# Patient Record
Sex: Female | Born: 2005 | Hispanic: Yes | Marital: Married | Smoking: Never smoker
Health system: Southern US, Community
[De-identification: ages and names within clinical notes are randomized; demographics above are authoritative.]

## PROBLEM LIST (undated history)

## (undated) DIAGNOSIS — F419 Anxiety disorder, unspecified: Secondary | ICD-10-CM

---
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
## 2023-11-05 ENCOUNTER — Ambulatory Visit: Admission: EM | Admit: 2023-11-05 | Discharge: 2023-11-05 | Disposition: A | Discharge status: Home / Self Care

## 2023-11-05 DIAGNOSIS — H6501 Acute serous otitis media, right ear: Secondary | ICD-10-CM

## 2023-11-05 DIAGNOSIS — H9201 Otalgia, right ear: Secondary | ICD-10-CM

## 2023-11-05 HISTORY — DX: Anxiety disorder, unspecified: F41.9

## 2023-11-05 MED ORDER — PSEUDOEPHEDRINE-GUAIFENESIN ER 60-600 MG PO TB12: 1.0000 | ORAL_TABLET | Freq: Two times a day (BID) | ORAL | 0 refills | Status: AC

## 2023-11-05 MED ORDER — IPRATROPIUM BROMIDE 0.06 % NA SOLN: 2.0000 | Freq: Four times a day (QID) | NASAL | 0 refills | Status: AC

## 2023-11-05 NOTE — ED Provider Notes (Signed)
 [facility]    CSN: [id number] Arrival date & time: 11/05/23  0841      History   Chief Complaint Chief Complaint  Patient presents with   Otalgia   Ear Fullness    HPI Janice Thornton is a 18 y.o. female presenting for onset of reduced hearing, right ear fullness and pressure yesterday.  Denies drainage from ear, fever, cough, congestion or sore throat.  No dizziness or headaches.  Has taken Tylenol over-the-counter.  History of allergies.  Takes Xyzal.  HPI  Past Medical History:  Diagnosis Date   Anxiety     There are no active problems to display for this patient.   No past surgical history on file.  OB History   No obstetric history on file.      Home Medications    Prior to Admission medications   Medication Sig Start Date End Date Taking? Authorizing Provider  famotidine (PEPCID) 20 MG tablet Take 20 mg by mouth 2 (two) times daily.   Yes [provider]  FLUoxetine (PROZAC) 20 MG capsule Take 20 mg by mouth daily. 08/13/23  Yes [provider]  ipratropium (ATROVENT ) 0.06 % nasal spray Place 2 sprays into both nostrils 4 (four) times daily. 11/05/23  Yes Nancy Axon B, PA-C  levocetirizine (XYZAL) 5 MG tablet SMARTSIG:1 Tablet(s) By Mouth Every Evening 08/19/23  Yes [provider]  metFORMIN (GLUCOPHAGE) 500 MG tablet Take 500 mg by mouth 2 (two) times daily. 08/15/23  Yes [provider]  montelukast (SINGULAIR) 10 MG tablet Take 10 mg by mouth daily. 08/13/23  Yes [provider]  pseudoephedrine -guaifenesin  (MUCINEX  D) 60-600 MG 12 hr tablet Take 1 tablet by mouth every 12 (twelve) hours. 11/05/23  Yes Floydene Hy, PA-C    Family History Family History  Problem Relation Age of Onset   Hypertension Mother    Diabetes Maternal Grandmother    Hypertension Maternal Grandfather    Diabetes Maternal Grandfather    Diabetes Paternal Grandmother     Social History Social History   Tobacco Use    Smoking status: Never   Smokeless tobacco: Never  Vaping Use   Vaping status: Never Used  Substance Use Topics   Alcohol use: Never   Drug use: Never     Allergies   Patient has no known allergies.   Review of Systems Review of Systems  Constitutional:  Negative for chills, diaphoresis, fatigue and fever.  HENT:  Positive for ear pain and hearing loss. Negative for congestion, ear discharge, rhinorrhea, sinus pressure, sinus pain and sore throat.   Respiratory:  Negative for cough.   Gastrointestinal:  Negative for nausea and vomiting.  Musculoskeletal:  Negative for neck pain.  Skin:  Negative for rash.  Neurological:  Negative for dizziness, weakness and headaches.  Hematological:  Negative for adenopathy.     Physical Exam Triage Vital Signs ED Triage Vitals  Encounter Vitals Group     BP      Systolic BP Percentile      Diastolic BP Percentile      Pulse      Resp      Temp      Temp src      SpO2      Weight      Height      Head Circumference      Peak Flow      Pain Score      Pain Loc  Pain Education      Exclude from Growth Chart    No data found.  Updated Vital Signs BP (!) 148/93 (BP Location: Right Arm)   Pulse 97   Temp 97.6 F (36.4 C) (Oral)   Resp 15   LMP 10/10/2023   SpO2 97%     Physical Exam Vitals and nursing note reviewed.  Constitutional:      General: She is not in acute distress.    Appearance: Normal appearance. She is not ill-appearing or toxic-appearing.  HENT:     Head: Normocephalic and atraumatic.     Right Ear: Ear canal and external ear normal. A middle ear effusion is present. Tympanic membrane is bulging (mildly).     Left Ear: Tympanic membrane, ear canal and external ear normal.     Nose: Congestion present.     Mouth/Throat:     Mouth: Mucous membranes are moist.     Pharynx: Oropharynx is clear.  Eyes:     General: No scleral icterus.       Right eye: No discharge.        Left eye: No discharge.      Conjunctiva/sclera: Conjunctivae normal.  Cardiovascular:     Rate and Rhythm: Normal rate and regular rhythm.     Heart sounds: Normal heart sounds.  Pulmonary:     Effort: Pulmonary effort is normal. No respiratory distress.     Breath sounds: Normal breath sounds.  Musculoskeletal:     Cervical back: Neck supple.  Skin:    General: Skin is dry.  Neurological:     General: No focal deficit present.     Mental Status: She is alert. Mental status is at baseline.     Motor: No weakness.     Gait: Gait normal.  Psychiatric:        Mood and Affect: Mood normal.        Behavior: Behavior normal.      UC Treatments / Results  Labs (all labs ordered are listed, but only abnormal results are displayed) Labs Reviewed - No data to display  EKG   Radiology No results found.  Procedures Procedures (including critical care time)  Medications Ordered in UC Medications - No data to display  Initial Impression / Assessment and Plan / UC Course  I have reviewed the triage vital signs and the nursing notes.  Pertinent labs & imaging results that were available during my care of the patient were reviewed by me and considered in my medical decision making (see chart for details).   17 year old female presents for right ear pressure and fullness since yesterday.  History of allergies.  Takes Xyzal.  No fever, cough, congestion or recent illness.  On evaluation she has effusion of right TM without erythema and with slight bulging.  No cerumen impaction.  Presentation consistent with eustachian tube dysfunction effusion of TM.  Sent Atrovent  nasal spray to pharmacy as well as Mucinex  D.  Continue allergy medicine.  Reviewed return precautions.   Final Clinical Impressions(s) / UC Diagnoses   Final diagnoses:  Otalgia of right ear  Right acute serous otitis media, recurrence not specified     Discharge Instructions      - You have fluid in your eardrum.  Use the nasal spray and  take the decongestants which will help to dry up the fluid and open things up. - You can take ibuprofen and Tylenol and use warm compresses to help with discomfort. - Return if  ear drainage, fever or worsening ear discomfort.     ED Prescriptions     Medication Sig Dispense Auth. Provider   ipratropium (ATROVENT ) 0.06 % nasal spray Place 2 sprays into both nostrils 4 (four) times daily. 15 mL Nancy Axon B, PA-C   pseudoephedrine -guaifenesin  (MUCINEX  D) 60-600 MG 12 hr tablet Take 1 tablet by mouth every 12 (twelve) hours. 14 tablet Alyssha Housh B, PA-C      PDMP not reviewed this encounter.   Floydene Hy, PA-C 11/05/23 1013

## 2023-11-05 NOTE — Discharge Instructions (Addendum)
-   You have fluid in your eardrum.  Use the nasal spray and take the decongestants which will help to dry up the fluid and open things up. - You can take ibuprofen and Tylenol and use warm compresses to help with discomfort. - Return if ear drainage, fever or worsening ear discomfort.

## 2023-11-05 NOTE — ED Triage Notes (Signed)
 Right ear pain and fullness since yesterday.
